# Patient Record
Sex: Female | Born: 1960 | Race: Black or African American | Hispanic: No | Smoking: Former smoker
Health system: Southern US, Community
[De-identification: ages and names within clinical notes are randomized; demographics above are authoritative.]

## PROBLEM LIST (undated history)

## (undated) DIAGNOSIS — I10 Essential (primary) hypertension: Secondary | ICD-10-CM

## (undated) HISTORY — PX: ABDOMINAL HYSTERECTOMY: SHX81

## (undated) HISTORY — PX: SKIN GRAFT: SHX250

---

## 2004-12-07 ENCOUNTER — Ambulatory Visit: Payer: Self-pay

## 2005-06-21 ENCOUNTER — Ambulatory Visit: Payer: Self-pay | Admitting: Otolaryngology

## 2006-01-16 ENCOUNTER — Ambulatory Visit: Payer: Self-pay

## 2007-01-19 ENCOUNTER — Ambulatory Visit: Payer: Self-pay

## 2007-07-17 ENCOUNTER — Emergency Department: Payer: Self-pay | Admitting: Emergency Medicine

## 2011-03-29 ENCOUNTER — Emergency Department: Payer: Self-pay | Admitting: Emergency Medicine

## 2012-03-20 ENCOUNTER — Ambulatory Visit: Payer: Self-pay | Admitting: Internal Medicine

## 2014-02-20 ENCOUNTER — Ambulatory Visit: Payer: Self-pay | Admitting: Family Medicine

## 2015-03-02 ENCOUNTER — Other Ambulatory Visit: Payer: Self-pay | Admitting: Family Medicine

## 2015-03-02 DIAGNOSIS — Z1231 Encounter for screening mammogram for malignant neoplasm of breast: Secondary | ICD-10-CM

## 2015-03-04 ENCOUNTER — Ambulatory Visit
Admission: RE | Admit: 2015-03-04 | Discharge: 2015-03-04 | Disposition: A | Discharge status: Home / Self Care | Payer: BLUE CROSS/BLUE SHIELD | Source: Ambulatory Visit | Attending: Family Medicine | Admitting: Family Medicine

## 2015-03-04 DIAGNOSIS — Z1231 Encounter for screening mammogram for malignant neoplasm of breast: Secondary | ICD-10-CM | POA: Insufficient documentation

## 2015-03-05 ENCOUNTER — Other Ambulatory Visit: Payer: Self-pay | Admitting: Family Medicine

## 2015-03-05 DIAGNOSIS — Z1231 Encounter for screening mammogram for malignant neoplasm of breast: Secondary | ICD-10-CM

## 2015-10-27 ENCOUNTER — Other Ambulatory Visit: Payer: Self-pay | Admitting: Family Medicine

## 2015-10-27 DIAGNOSIS — Z1231 Encounter for screening mammogram for malignant neoplasm of breast: Secondary | ICD-10-CM

## 2016-03-07 ENCOUNTER — Other Ambulatory Visit: Payer: Self-pay | Admitting: Family Medicine

## 2016-03-07 ENCOUNTER — Ambulatory Visit
Admission: RE | Admit: 2016-03-07 | Discharge: 2016-03-07 | Disposition: A | Discharge status: Home / Self Care | Payer: BLUE CROSS/BLUE SHIELD | Source: Ambulatory Visit | Attending: Family Medicine | Admitting: Family Medicine

## 2016-03-07 DIAGNOSIS — Z1231 Encounter for screening mammogram for malignant neoplasm of breast: Secondary | ICD-10-CM

## 2017-04-19 ENCOUNTER — Ambulatory Visit
Admission: EM | Admit: 2017-04-19 | Discharge: 2017-04-19 | Disposition: A | Discharge status: Home / Self Care | Payer: BLUE CROSS/BLUE SHIELD | Attending: Family Medicine | Admitting: Family Medicine

## 2017-04-19 DIAGNOSIS — S29011A Strain of muscle and tendon of front wall of thorax, initial encounter: Secondary | ICD-10-CM | POA: Diagnosis not present

## 2017-04-19 HISTORY — DX: Essential (primary) hypertension: I10

## 2017-04-19 MED ORDER — HYDROCODONE-ACETAMINOPHEN 5-325 MG PO TABS: ORAL_TABLET | ORAL | 0 refills | Status: DC

## 2017-04-19 MED ORDER — CYCLOBENZAPRINE HCL 10 MG PO TABS: 10.0000 mg | ORAL_TABLET | Freq: Every day | ORAL | 0 refills | Status: DC

## 2017-04-19 NOTE — ED Provider Notes (Signed)
[facility]    CSN: [id number] Arrival date & time: 04/19/17  1517     History   Chief Complaint Chief Complaint  Patient presents with  . Back Pain    HPI Evelyn Roberts is a 56 y.o. female.   56 yo female with a c/o left sided lower rib pain for 5 days since helping lift a bathtub. Pain is worse with movement and twisting.    The history is provided by the patient.  Back Pain    Past Medical History:  Diagnosis Date  . Hypertension     There are no active problems to display for this patient.   Past Surgical History:  Procedure Laterality Date  . ABDOMINAL HYSTERECTOMY    . SKIN GRAFT      OB History    No data available       Home Medications    Prior to Admission medications   Medication Sig Start Date End Date Taking? Authorizing Provider  cyclobenzaprine (FLEXERIL) 10 MG tablet Take 1 tablet (10 mg total) by mouth at bedtime. 04/19/17   Payton Mccallum, MD  HYDROcodone-acetaminophen (NORCO/VICODIN) 5-325 MG tablet 1-2 tabs po qd prn 04/19/17   Payton Mccallum, MD    Family History Family History  Problem Relation Age of Onset  . Breast cancer Cousin        pat cousin    Social History Social History  Substance Use Topics  . Smoking status: Current Every Day Smoker  . Smokeless tobacco: Never Used  . Alcohol use No     Allergies   Patient has no known allergies.   Review of Systems Review of Systems  Musculoskeletal: Positive for back pain.     Physical Exam Triage Vital Signs ED Triage Vitals  Enc Vitals Group     BP 04/19/17 1532 128/66     Pulse Rate 04/19/17 1532 76     Resp 04/19/17 1532 16     Temp 04/19/17 1532 98 F (36.7 C)     Temp Source 04/19/17 1532 Oral     SpO2 04/19/17 1532 100 %     Weight 04/19/17 1532 171 lb 4.8 oz (77.7 kg)     Height 04/19/17 1532  (1.702 m)     Head Circumference --      Peak Flow --      Pain Score 04/19/17 1535 5     Pain Loc --      Pain Edu? --    Excl. in GC? --    No data found.   Updated Vital Signs BP 128/66 (BP Location: Left Arm)   Pulse 76   Temp 98 F (36.7 C) (Oral)   Resp 16   Ht  (1.702 m)   Wt 171 lb 4.8 oz (77.7 kg)   SpO2 100%   BMI 26.83 kg/m   Visual Acuity Right Eye Distance:   Left Eye Distance:   Bilateral Distance:    Right Eye Near:   Left Eye Near:    Bilateral Near:     Physical Exam  Constitutional: She appears well-developed and well-nourished. No distress.  Cardiovascular: Normal rate, regular rhythm and normal heart sounds.   Pulmonary/Chest: Effort normal and breath sounds normal. No respiratory distress. She has no wheezes. She has no rales. She exhibits tenderness (left lower lateral chest wall ).  Skin: She is not diaphoretic.  Nursing note and vitals reviewed.    UC Treatments / Results  Labs (all  labs ordered are listed, but only abnormal results are displayed) Labs Reviewed - No data to display  EKG  EKG Interpretation None       Radiology No results found.  Procedures Procedures (including critical care time)  Medications Ordered in UC Medications - No data to display   Initial Impression / Assessment and Plan / UC Course  I have reviewed the triage vital signs and the nursing notes.  Pertinent labs & imaging results that were available during my care of the patient were reviewed by me and considered in my medical decision making (see chart for details).       Final Clinical Impressions(s) / UC Diagnoses   Final diagnoses:  Muscle strain of chest wall, initial encounter    New Prescriptions Discharge Medication List as of 04/19/2017  4:18 PM    START taking these medications   Details  cyclobenzaprine (FLEXERIL) 10 MG tablet Take 1 tablet (10 mg total) by mouth at bedtime., Starting Wed 04/19/2017, Normal    HYDROcodone-acetaminophen (NORCO/VICODIN) 5-325 MG tablet 1-2 tabs po qd prn, Print       1. diagnosis reviewed with patient 2. rx as  per orders above; reviewed possible side effects, interactions, risks and benefits  3. Recommend supportive treatment with rest, ice 4. Follow-up prn if symptoms worsen or don't improve Controlled Substance Prescriptions [place] Controlled Substance Registry consulted? No   Payton Mccallumonty, Edman Lipsey, MD 04/19/17 [phone]

## 2017-04-19 NOTE — ED Triage Notes (Signed)
Pt reports left sided thoracic back pain that started after helping lift a bathtub on Saturday. Pt reports minimal pain relief with ice and motrin.

## 2017-06-08 ENCOUNTER — Ambulatory Visit
Admission: EM | Admit: 2017-06-08 | Discharge: 2017-06-08 | Disposition: A | Discharge status: Home / Self Care | Payer: BLUE CROSS/BLUE SHIELD | Attending: Family Medicine | Admitting: Family Medicine

## 2017-06-08 DIAGNOSIS — W260XXA Contact with knife, initial encounter: Secondary | ICD-10-CM | POA: Diagnosis not present

## 2017-06-08 DIAGNOSIS — S61210A Laceration without foreign body of right index finger without damage to nail, initial encounter: Secondary | ICD-10-CM | POA: Diagnosis not present

## 2017-06-08 NOTE — ED Notes (Signed)
Wound cleansed with NS and Hibiclens 

## 2017-06-08 NOTE — ED Provider Notes (Signed)
[facility]    CSN: [id number] Arrival date & time: 06/08/17  1635     History   Chief Complaint Chief Complaint  Patient presents with  . Extremity Laceration   HPI  56 year old female presents with a right index finger laceration.  Patient states that she was reaching into her work bag and cut herself on an open box cutter.  Bleeding was controlled with pressure.  She presented immediately for evaluation.  Minimal pain.  No nail injury.  No reports of foreign body.  Tetanus up-to-date.  No other associated symptoms.  No other complaints or concerns at this time.  Past Medical History:  Diagnosis Date  . Hypertension    Past Surgical History:  Procedure Laterality Date  . ABDOMINAL HYSTERECTOMY    . SKIN GRAFT     OB History    No data available     Home Medications    Prior to Admission medications   Medication Sig Start Date End Date Taking? Authorizing Provider  lisinopril-hydrochlorothiazide (PRINZIDE,ZESTORETIC) 10-12.5 MG tablet Take 1 tablet by mouth daily.   Yes [provider]  cyclobenzaprine (FLEXERIL) 10 MG tablet Take 1 tablet (10 mg total) by mouth at bedtime. 04/19/17   Payton Mccallumonty, [place], MD  HYDROcodone-acetaminophen (NORCO/VICODIN) 5-325 MG tablet 1-2 tabs po qd prn 04/19/17   Payton Mccallumonty, [place], MD    Family History Family History  Problem Relation Age of Onset  . Breast cancer Cousin        pat cousin    Social History Social History  Substance Use Topics  . Smoking status: Current Every Day Smoker    Types: Cigarettes  . Smokeless tobacco: Never Used     Comment: 4 per day  . Alcohol use No     Allergies   Patient has no known allergies.   Review of Systems Review of Systems  Constitutional: Negative.   Skin: Positive for wound.       No nail injury.   Physical Exam Triage Vital Signs ED Triage Vitals  Enc Vitals Group     BP 06/08/17 1648 128/71     Pulse Rate 06/08/17 1648 69     Resp 06/08/17 1648 18   Temp 06/08/17 1648 98.1 F (36.7 C)     Temp Source 06/08/17 1648 Oral     SpO2 06/08/17 1648 100 %     Weight 06/08/17 1651 174 lb (78.9 kg)     Height 06/08/17 1651 5\' 7"  (1.702 m)     Head Circumference --      Peak Flow --      Pain Score 06/08/17 1651 2     Pain Loc --      Pain Edu? --      Excl. in GC? --   Updated Vital Signs BP 128/71 (BP Location: Left Arm)   Pulse 69   Temp 98.1 F (36.7 C) (Oral)   Resp 18   Ht 5\' 7"  (1.702 m)   Wt 174 lb (78.9 kg)   SpO2 100%   BMI 27.25 kg/m    Physical Exam  Constitutional: She is oriented to person, place, and time. She appears well-developed. No distress.  HENT:  Head: Normocephalic and atraumatic.  Nose: Nose normal.  Eyes: Conjunctivae are normal. No scleral icterus.  Pulmonary/Chest: Effort normal. No respiratory distress.  Neurological: She is alert and oriented to person, place, and time.  Skin:  Right index finger - ~ 1.5 cm curvilinear laceration.  Fairly superficial.  Bleeding controlled with pressure.  No nailbed involvement.  Psychiatric: She has a normal mood and affect. Her behavior is normal.  Vitals reviewed.  UC Treatments / Results  Labs (all labs ordered are listed, but only abnormal results are displayed) Labs Reviewed - No data to display  EKG  EKG Interpretation None       Radiology No results found.  Procedures .Marland KitchenLaceration Repair Date/Time: 06/08/2017 5:32 PM Performed by: Tommie Sams Authorized by: Tommie Sams   Consent:    Consent obtained:  Verbal   Consent given by:  Patient Anesthesia (see MAR for exact dosages):    Anesthesia method:  None Laceration details:    Location: Right index finger.   Length (cm):  1.5 Repair type:    Repair type:  Simple Exploration:    Hemostasis achieved with:  Direct pressure   Contaminated: no   Treatment:    Area cleansed with:  Soap and water   Amount of cleaning:  Standard Skin repair:    Repair method:  Tissue adhesive and  Steri-Strips   Number of Steri-Strips:  3 Approximation:    Approximation:  Close   V[place] border: well-aligned   Post-procedure details:    Dressing:  Bulky dressing   Patient tolerance of procedure:  Tolerated well, no immediate complications   (including critical care time)  Medications Ordered in UC Medications - No data to display   Initial Impression / Assessment and Plan / UC Course  I have reviewed the triage vital signs and the nursing notes.  Pertinent labs & imaging results that were available during my care of the patient were reviewed by me and considered in my medical decision making (see chart for details).     56 year old female presents with a finger laceration.  Superficial.  Closed with Dermabond and Steri-Strips.  Final Clinical Impressions(s) / UC Diagnoses   Final diagnoses:  Laceration of right index finger without foreign body without damage to nail, initial encounter    New Prescriptions Discharge Medication List as of 06/08/2017  5:20 PM     Controlled Substance Prescriptions [place] Controlled Substance Registry consulted? Not Applicable   Tommie Sams, DO 06/08/17 1734

## 2017-06-08 NOTE — ED Triage Notes (Signed)
Pt reached into her bookbag and didn't realize an open box cutter was in there and she sliced her first finger right hand. Minimal pain.

## 2017-06-08 NOTE — ED Notes (Signed)
telfa dressing applied over wound after closure, wrapped in kerlix and coban

## 2017-06-08 NOTE — Discharge Instructions (Signed)
You can change the dressing.  The steristrips and glue will fall of in 5-7 days.  Take care  Dr. Adriana Simasook

## 2018-02-23 ENCOUNTER — Other Ambulatory Visit: Payer: Self-pay | Admitting: Family Medicine

## 2018-02-23 DIAGNOSIS — Z1231 Encounter for screening mammogram for malignant neoplasm of breast: Secondary | ICD-10-CM

## 2018-02-28 ENCOUNTER — Ambulatory Visit
Admission: RE | Admit: 2018-02-28 | Discharge: 2018-02-28 | Disposition: A | Discharge status: Home / Self Care | Payer: BLUE CROSS/BLUE SHIELD | Source: Ambulatory Visit | Attending: Family Medicine | Admitting: Family Medicine

## 2018-02-28 DIAGNOSIS — Z1231 Encounter for screening mammogram for malignant neoplasm of breast: Secondary | ICD-10-CM | POA: Diagnosis not present

## 2019-02-22 ENCOUNTER — Other Ambulatory Visit: Payer: Self-pay | Admitting: Family Medicine

## 2019-02-22 DIAGNOSIS — Z1231 Encounter for screening mammogram for malignant neoplasm of breast: Secondary | ICD-10-CM

## 2019-03-12 ENCOUNTER — Ambulatory Visit
Admission: RE | Admit: 2019-03-12 | Discharge: 2019-03-12 | Disposition: A | Discharge status: Home / Self Care | Payer: Managed Care, Other (non HMO) | Source: Ambulatory Visit | Attending: Family Medicine | Admitting: Family Medicine

## 2019-03-12 ENCOUNTER — Other Ambulatory Visit: Payer: Self-pay

## 2019-03-12 DIAGNOSIS — Z1231 Encounter for screening mammogram for malignant neoplasm of breast: Secondary | ICD-10-CM | POA: Diagnosis not present

## 2019-11-09 ENCOUNTER — Ambulatory Visit: Payer: Self-pay | Attending: Internal Medicine

## 2019-11-09 DIAGNOSIS — Z23 Encounter for immunization: Secondary | ICD-10-CM

## 2019-11-09 NOTE — Progress Notes (Signed)
   Covid-19 Vaccination Clinic  Name:  Valentina Alcoser    MRN: [id number] DOB: [birth year]  11/09/2019  [name] was observed post Covid-19 immunization for 15 minutes without incident. She was provided with Vaccine Information Sheet and instruction to access the V-Safe system.   Ms. Rasmus was instructed to call 911 with any severe reactions post vaccine: Marland Kitchen Difficulty breathing  . Swelling of face and throat  . A fast heartbeat  . A bad rash all over body  . Dizziness and weakness   Immunizations Administered    Name Date Dose VIS Date Route   Pfizer COVID-19 Vaccine 11/09/2019  8:21 AM 0.3 mL 07/19/2019 Intramuscular   Manufacturer: [employer], [employer]   Lot: [id number]   NDC: [address]-0

## 2019-11-30 ENCOUNTER — Ambulatory Visit: Payer: Self-pay | Attending: Internal Medicine

## 2019-11-30 DIAGNOSIS — Z23 Encounter for immunization: Secondary | ICD-10-CM

## 2019-11-30 NOTE — Progress Notes (Signed)
   Covid-19 Vaccination Clinic  Name:  Evelyn Roberts    MRN: [id number] DOB: September 08, 1960  11/30/2019  [name] was observed post Covid-19 immunization for 15 minutes without incident. She was provided with Vaccine Information Sheet and instruction to access the V-Safe system.   [name] was instructed to call 911 with any severe reactions post vaccine: Marland Kitchen Difficulty breathing  . Swelling of face and throat  . A fast heartbeat  . A bad rash all over body  . Dizziness and weakness   Immunizations Administered    Name Date Dose VIS Date Route   Pfizer COVID-19 Vaccine 11/30/2019  9:36 AM 0.3 mL 10/02/2018 Intramuscular   Manufacturer: [employer], [employer]   Lot: [id number]   NDC: [address]-1

## 2019-12-03 ENCOUNTER — Ambulatory Visit: Payer: Self-pay

## 2020-03-04 ENCOUNTER — Other Ambulatory Visit: Payer: Self-pay | Admitting: Family Medicine

## 2020-03-04 DIAGNOSIS — Z1231 Encounter for screening mammogram for malignant neoplasm of breast: Secondary | ICD-10-CM

## 2020-09-25 ENCOUNTER — Ambulatory Visit
Admission: EM | Admit: 2020-09-25 | Discharge: 2020-09-25 | Disposition: A | Discharge status: Home / Self Care | Payer: BC Managed Care – PPO | Attending: Physician Assistant | Admitting: Physician Assistant

## 2020-09-25 ENCOUNTER — Encounter: Payer: Self-pay | Admitting: Emergency Medicine

## 2020-09-25 ENCOUNTER — Other Ambulatory Visit: Payer: Self-pay

## 2020-09-25 DIAGNOSIS — M545 Low back pain, unspecified: Secondary | ICD-10-CM | POA: Diagnosis not present

## 2020-09-25 DIAGNOSIS — S39012A Strain of muscle, fascia and tendon of lower back, initial encounter: Secondary | ICD-10-CM

## 2020-09-25 MED ORDER — TIZANIDINE HCL 4 MG PO TABS: 4.0000 mg | ORAL_TABLET | Freq: Three times a day (TID) | ORAL | 0 refills | Status: AC | PRN

## 2020-09-25 MED ORDER — NAPROXEN 500 MG PO TABS: 500.0000 mg | ORAL_TABLET | Freq: Two times a day (BID) | ORAL | 0 refills | Status: AC

## 2020-09-25 NOTE — ED Triage Notes (Signed)
Patient c/o lower back pain for a week.  Patient denies any urinary symptoms.  Patient denies vaginal discharge or odor.  Patient states that she did a lot of bending and yard work last Saturday.

## 2020-09-25 NOTE — Discharge Instructions (Addendum)

## 2020-09-25 NOTE — ED Provider Notes (Signed)
[facility]    CSN: [id number] Arrival date & time: 09/25/20  1509      History   Chief Complaint Chief Complaint  Patient presents with  . Back Pain    HPI Evelyn Roberts is a 60 y.o. female presenting for bilateral lower back pain for 1 week.  Patient denies any specific injury, but states that she has been doing a lot more lifting, bending and stooping.  States she has been doing that more at work and in her yard.  Says she has been helping her with yard work.  Patient says that the pain is constant and moderate and sometimes severe.  He says that she does feel pain in the back of her thighs as well.  She denies any associated numbness, weakness or tingling.  No saddle anesthesia or loss of bowel or bladder control.  Patient denies any problems with her back in the past.  She has taken over-the-counter [employer]ayer back relief with temporary improvement in symptoms.  She says she also uses heat which does help.  Patient says she has been trying to do some stretches at home and bending forward seems to feel good.  Patient denies any fever, fatigue, dysuria, urinary frequency or urgency.  No abdominal pain, nausea vomiting or diarrhea.  Patient states that she thought she would get checked out since she is not better after a week.  She has no other complaints or concerns today.  HPI  Past Medical History:  Diagnosis Date  . Hypertension     There are no problems to display for this patient.   Past Surgical History:  Procedure Laterality Date  . ABDOMINAL HYSTERECTOMY    . SKIN GRAFT      OB History   No obstetric history on file.      Home Medications    Prior to Admission medications   Medication Sig Start Date End Date Taking? Authorizing Provider  lisinopril-hydrochlorothiazide (PRINZIDE,ZESTORETIC) 10-12.5 MG tablet Take 1 tablet by mouth daily.   Yes [provider]  naproxen (NAPROSYN) 500 MG tablet Take 1 tablet (500 mg total) by mouth 2 (two)  times daily for 15 days. 09/25/20 10/10/20 Yes Eusebio Friendly B, PA-C  tiZANidine (ZANAFLEX) 4 MG tablet Take 1 tablet (4 mg total) by mouth every 8 (eight) hours as needed for up to 10 days for muscle spasms. 09/25/20 10/05/20 Yes Shirlee Latch, PA-C    Family History Family History  Problem Relation Age of Onset  . Breast cancer Cousin        2 pat cousins    Social History Social History   Tobacco Use  . Smoking status: Former Smoker    Types: Cigarettes  . Smokeless tobacco: Never Used  . Tobacco comment: 4 per day  Vaping Use  . Vaping Use: Never used  Substance Use Topics  . Alcohol use: No  . Drug use: No     Allergies   Patient has no known allergies.   Review of Systems Review of Systems  Constitutional: Negative for fatigue and fever.  Respiratory: Negative for shortness of breath.   Cardiovascular: Negative for chest pain.  Gastrointestinal: Negative for abdominal pain, diarrhea, nausea and vomiting.  Genitourinary: Negative for difficulty urinating, dysuria, frequency and urgency.  Musculoskeletal: Positive for back pain.  Skin: Negative for rash.  Neurological: Negative for weakness and numbness.     Physical Exam Triage Vital Signs ED Triage Vitals  Enc Vitals Group     BP  09/25/20 1535 129/82     Pulse Rate 09/25/20 1535 69     Resp 09/25/20 1535 14     Temp 09/25/20 1535 98.5 F (36.9 C)     Temp Source 09/25/20 1535 Oral     SpO2 09/25/20 1535 99 %     Weight 09/25/20 1532 162 lb (73.5 kg)     Height 09/25/20 1532 5\' 7"  (1.702 m)     Head Circumference --      Peak Flow --      Pain Score 09/25/20 1532 8     Pain Loc --      Pain Edu? --      Excl. in GC? --    No data found.  Updated Vital Signs BP 129/82 (BP Location: Right Arm)   Pulse 69   Temp 98.5 F (36.9 C) (Oral)   Resp 14   Ht 5\' 7"  (1.702 m)   Wt 162 lb (73.5 kg)   SpO2 99%   BMI 25.37 kg/m       Physical Exam Vitals and nursing note reviewed.  Constitutional:       General: She is not in acute distress.    Appearance: Normal appearance. She is not ill-appearing or toxic-appearing.  HENT:     Head: Normocephalic and atraumatic.  Eyes:     General: No scleral icterus.       Right eye: No discharge.        Left eye: No discharge.     Conjunctiva/sclera: Conjunctivae normal.  Cardiovascular:     Rate and Rhythm: Normal rate and regular rhythm.     Heart sounds: Normal heart sounds.  Pulmonary:     Effort: Pulmonary effort is normal. No respiratory distress.     Breath sounds: Normal breath sounds.  Abdominal:     Palpations: Abdomen is soft.     Tenderness: There is no right CVA tenderness or left CVA tenderness.  Musculoskeletal:     Cervical back: Neck supple.     Lumbar back: Tenderness (Bilateral paralumbar tenderness.  More tenderness on the right than the left.  No spinal tenderness.) present. No bony tenderness. Normal range of motion. Negative right straight leg raise test and negative left straight leg raise test.  Skin:    General: Skin is dry.  Neurological:     General: No focal deficit present.     Mental Status: She is alert. Mental status is at baseline.     Motor: No weakness.     Gait: Gait normal.  Psychiatric:        Mood and Affect: Mood normal.        Behavior: Behavior normal.        Thought Content: Thought content normal.      UC Treatments / Results  Labs (all labs ordered are listed, but only abnormal results are displayed) Labs Reviewed - No data to display  EKG   Radiology No results found.  Procedures Procedures (including critical care time)  Medications Ordered in UC Medications - No data to display  Initial Impression / Assessment and Plan / UC Course  I have reviewed the triage vital signs and the nursing notes.  Pertinent labs & imaging results that were available during my care of the patient were reviewed by me and considered in my medical decision making (see chart for details).    60 year old female presenting for atraumatic lower back pain x1 week.  Exam significant for tenderness of the paralumbar  region bilaterally, worse on the right side.  Good range of motion, strength and sensation.  All vital signs are stable in clinic and she is overall well-appearing.  Advised supportive care at this time with RICE.  Also advised taking Tylenol for pain relief.  Sent naproxen to pharmacy and tizanidine.  Encouraged use of heat and over-the-counter muscle rubs and pain relief patches.  Advised to follow-up with our department or orthopedics if not getting better over the next couple of weeks.  ED precautions for back pain reviewed with patient.   Final Clinical Impressions(s) / UC Diagnoses   Final diagnoses:  Acute bilateral low back pain without sciatica  Strain of lumbar region, initial encounter     Discharge Instructions     BACK PAIN: Stressed avoiding painful activities . RICE (REST, ICE, COMPRESSION, ELEVATION) guidelines reviewed. May alternate ice and heat. Consider use of muscle rubs, Salonpas patches, etc. Use medications as directed including muscle relaxers if prescribed. Take anti-inflammatory medications as prescribed or OTC NSAIDs/Tylenol.  F/u with PCP in 7-10 days for reexamination, and please feel free to call or return to the urgent care at any time for any questions or concerns you may have and we will be happy to help you!   BACK PAIN RED FLAGS: If the back pain acutely worsens or there are any red flag symptoms such as numbness/tingling, leg weakness, saddle anesthesia, or loss of bowel/bladder control, go immediately to the ER. Follow up with [place] as scheduled or sooner if the pain does not begin to resolve or if it worsens before the follow up      ED Prescriptions    Medication Sig Dispense Auth. Provider   naproxen (NAPROSYN) 500 MG tablet Take 1 tablet (500 mg total) by mouth 2 (two) times daily for 15 days. 30 tablet Eusebio Friendly B, PA-C    tiZANidine (ZANAFLEX) 4 MG tablet Take 1 tablet (4 mg total) by mouth every 8 (eight) hours as needed for up to 10 days for muscle spasms. 30 tablet Shirlee Latch, PA-C     I have reviewed the PDMP during this encounter.   Shirlee Latch, PA-C 09/25/20 1637

## 2020-11-17 IMAGING — MG DIGITAL SCREENING BILATERAL MAMMOGRAM WITH TOMO AND CAD
8 series · 8 of 24 positions shown · non-contrast
Comparison: Previous exam(s).

CLINICAL DATA: Screening.

EXAM:
DIGITAL SCREENING BILATERAL MAMMOGRAM WITH TOMO AND CAD

[R MLO synth-2D]
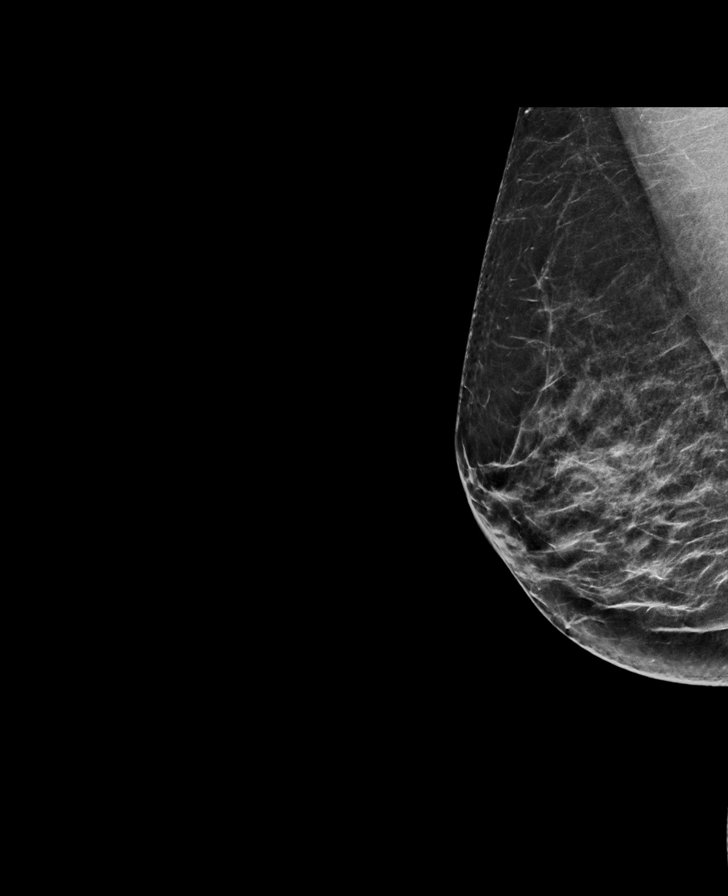

[L CC synth-2D]
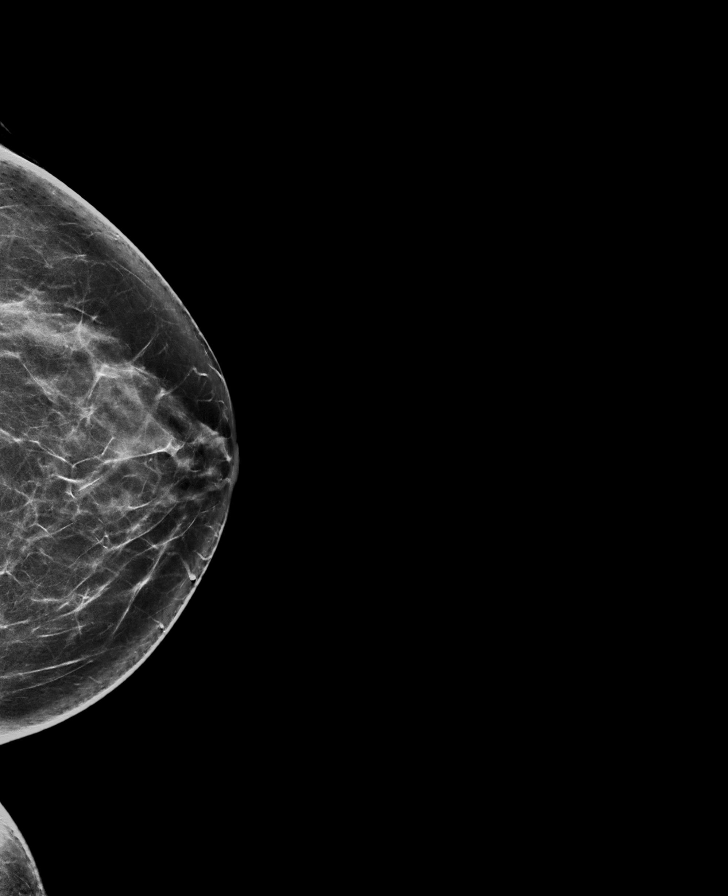

[L MLO synth-2D]
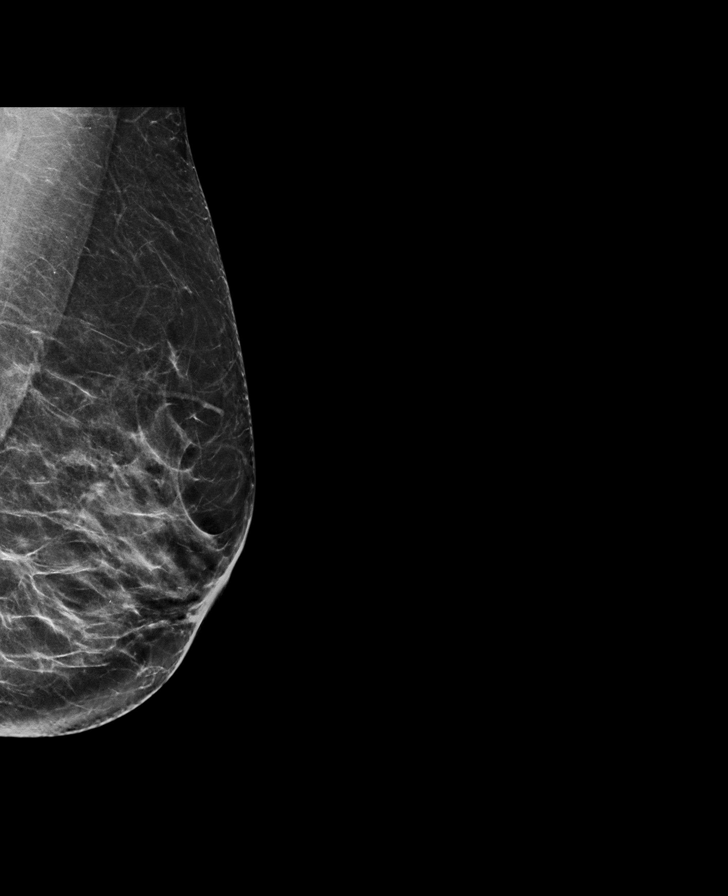

[R CC synth-2D]
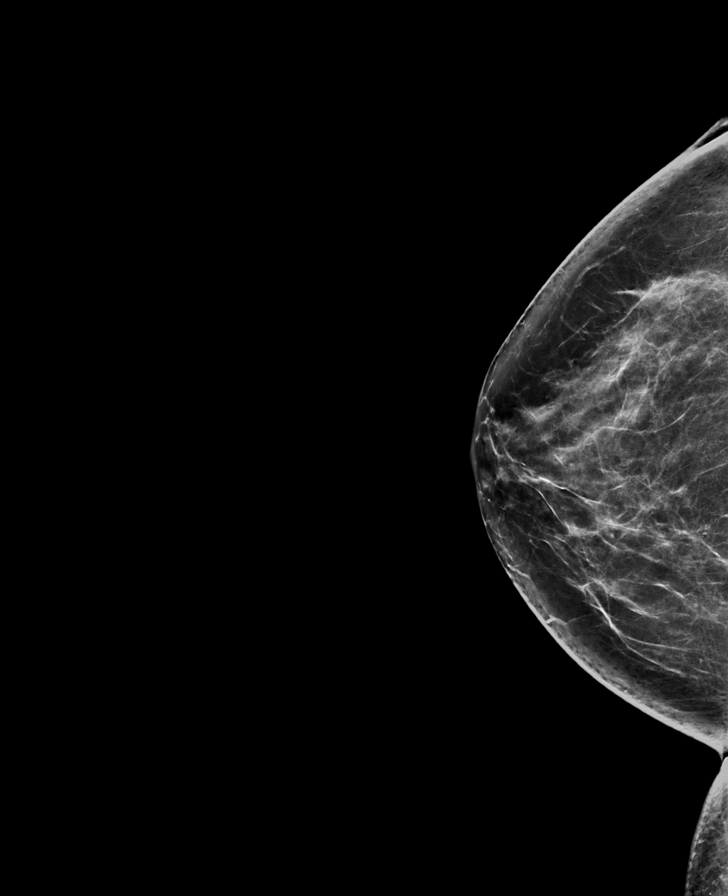

[L CC tomo · tomo slice 40/79.0]
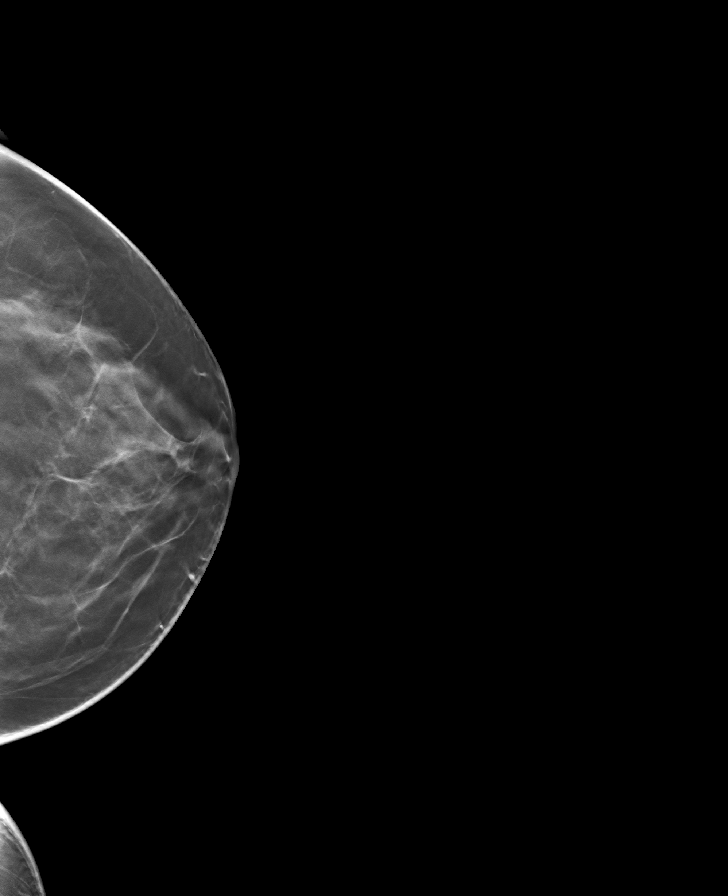

[R MLO tomo · tomo slice 35/70.0]
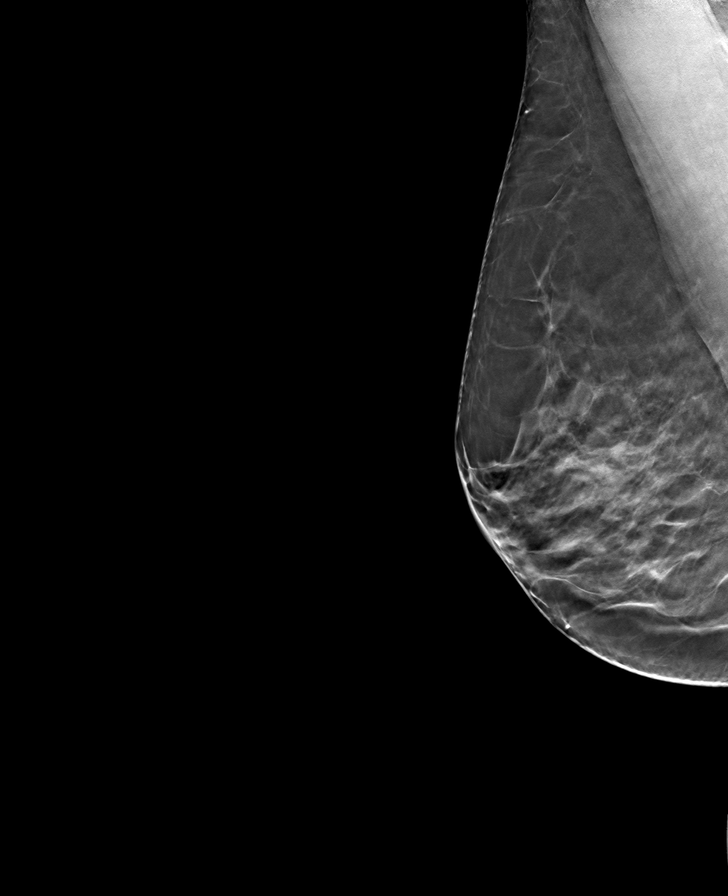

[R CC tomo · tomo slice 39/77.0]
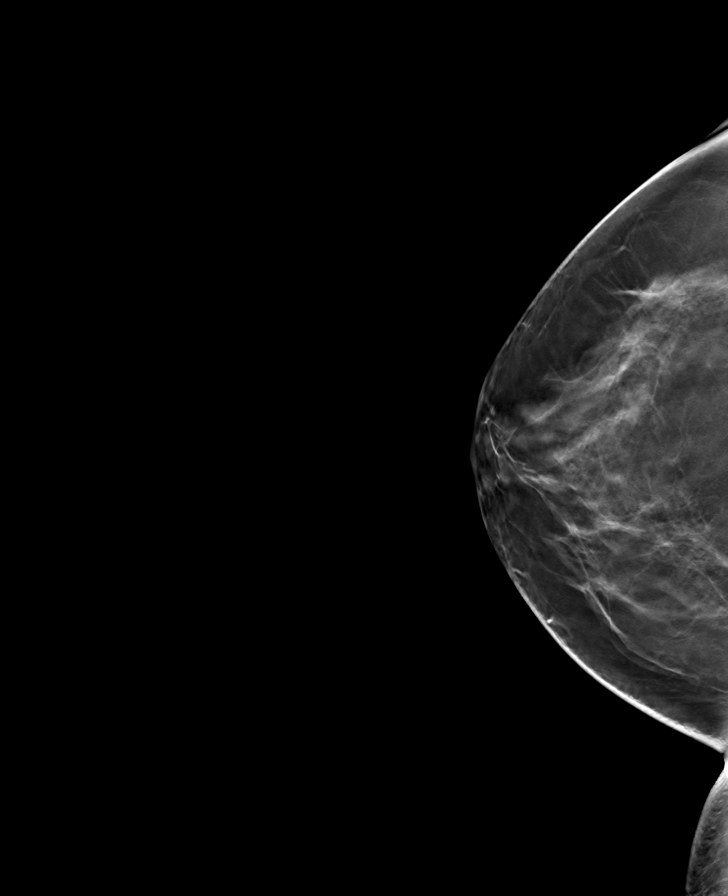

[L MLO tomo · tomo slice 37/72.0]
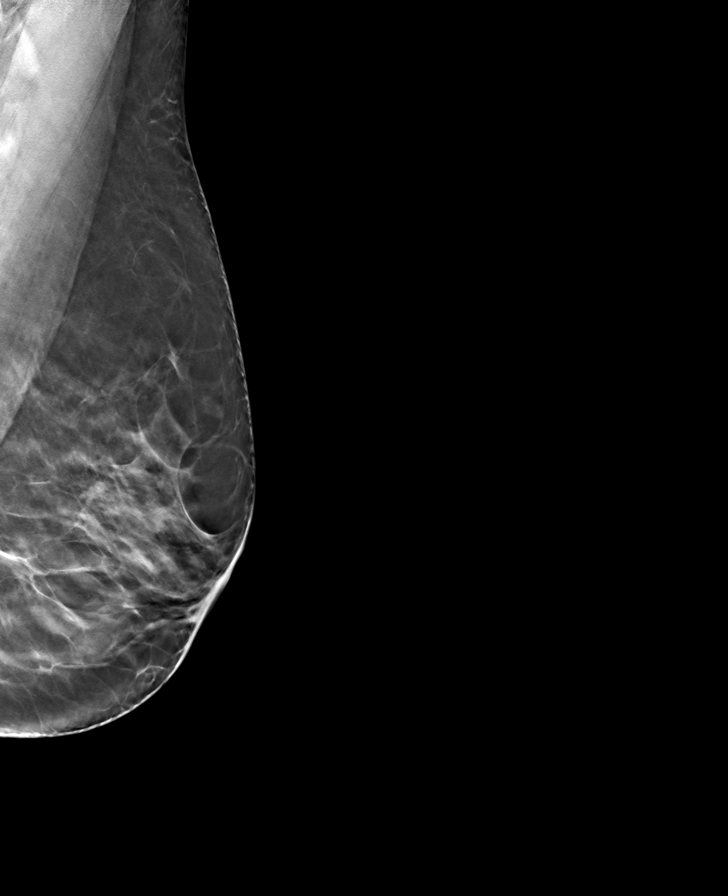

[8 of 24 positions shown; findings below may reference images not displayed]

ACR Breast Density Category c: The breast tissue is heterogeneously
dense, which may obscure small masses.
FINDINGS: There are no findings suspicious for malignancy. Images were
processed with CAD.
IMPRESSION: No mammographic evidence of malignancy. A result letter of this
screening mammogram will be mailed directly to the patient.

RECOMMENDATION:
Screening mammogram in one year. (Code:FT-U-LHB)

BI-RADS CATEGORY  1: Negative.

## 2021-01-31 ENCOUNTER — Ambulatory Visit (INDEPENDENT_AMBULATORY_CARE_PROVIDER_SITE_OTHER): Payer: 59

## 2021-01-31 ENCOUNTER — Ambulatory Visit
Admission: EM | Admit: 2021-01-31 | Discharge: 2021-01-31 | Disposition: A | Discharge status: Home / Self Care | Payer: 59

## 2021-01-31 ENCOUNTER — Encounter: Payer: Self-pay | Admitting: Emergency Medicine

## 2021-01-31 ENCOUNTER — Other Ambulatory Visit: Payer: Self-pay

## 2021-01-31 DIAGNOSIS — M7918 Myalgia, other site: Secondary | ICD-10-CM

## 2021-01-31 DIAGNOSIS — M954 Acquired deformity of chest and rib: Secondary | ICD-10-CM | POA: Diagnosis not present

## 2021-01-31 DIAGNOSIS — R079 Chest pain, unspecified: Secondary | ICD-10-CM | POA: Diagnosis not present

## 2021-01-31 MED ORDER — METAXALONE 800 MG PO TABS: 800.0000 mg | ORAL_TABLET | Freq: Three times a day (TID) | ORAL | 0 refills | Status: AC

## 2021-01-31 MED ORDER — DICLOFENAC SODIUM 50 MG PO TBEC: 50.0000 mg | DELAYED_RELEASE_TABLET | Freq: Two times a day (BID) | ORAL | 0 refills | Status: AC

## 2021-01-31 NOTE — ED Provider Notes (Signed)
[facility]    CSN: [id number] Arrival date & time: 01/31/21  1031      History   Chief Complaint Chief Complaint  Patient presents with   Shoulder Pain    left    HPI Evelyn Roberts is a 60 y.o. female who presents with  L mid scapula pain x 1 weeks. Denies injury, just woke up one day with it. Pain is provoked with movement of L arm to reach back. Admits of doing repetitive things with her L arm the day before.    Past Medical History:  Diagnosis Date   Hypertension     There are no problems to display for this patient.   Past Surgical History:  Procedure Laterality Date   ABDOMINAL HYSTERECTOMY     SKIN GRAFT      OB History   No obstetric history on file.      Home Medications    Prior to Admission medications   Medication Sig Start Date End Date Taking? Authorizing Provider  diclofenac (VOLTAREN) 50 MG EC tablet Take 1 tablet (50 mg total) by mouth 2 (two) times daily. For pain and inflammation 01/31/21  Yes Rodriguez-Southworth, Nettie Elm, PA-C  lisinopril-hydrochlorothiazide (PRINZIDE,ZESTORETIC) 10-12.5 MG tablet Take 1 tablet by mouth daily.   Yes [provider]  metaxalone (SKELAXIN) 800 MG tablet Take 1 tablet (800 mg total) by mouth 3 (three) times daily. 01/31/21  Yes Rodriguez-Southworth, Nettie Elm, PA-C  Potassium Chloride ER 20 MEQ TBCR Take 1 tablet by mouth daily. 01/31/21  Yes [provider]    Family History Family History  Problem Relation Age of Onset   Breast cancer Cousin        2 pat cousins    Social History Social History   Tobacco Use   Smoking status: Former    Pack years: 0.00    Types: Cigarettes   Smokeless tobacco: Never   Tobacco comments:    4 per day  Vaping Use   Vaping Use: Never used  Substance Use Topics   Alcohol use: No   Drug use: No     Allergies   Patient has no known allergies.   Review of Systems Review of Systems  Constitutional:  Negative for fever and  unexpected weight change.  Respiratory:  Negative for cough, chest tightness and shortness of breath.   Cardiovascular:  Negative for chest pain.  Musculoskeletal:  Positive for back pain. Negative for arthralgias.  Skin:  Negative for rash and wound.  Hematological:  Negative for adenopathy.    Physical Exam Triage Vital Signs ED Triage Vitals  Enc Vitals Group     BP 01/31/21 1050 135/78     Pulse Rate 01/31/21 1050 61     Resp 01/31/21 1050 18     Temp 01/31/21 1050 98.5 F (36.9 C)     Temp Source 01/31/21 1050 Oral     SpO2 01/31/21 1050 100 %     Weight 01/31/21 1051 162 lb 0.6 oz (73.5 kg)     Height 01/31/21 1051 5\' 7"  (1.702 m)     Head Circumference --      Peak Flow --      Pain Score 01/31/21 1051 3     Pain Loc --      Pain Edu? --      Excl. in GC? --    No data found.  Updated Vital Signs BP 135/78 (BP Location: Right Arm)   Pulse 61   Temp 98.5  F (36.9 C) (Oral)   Resp 18   Ht 5\' 7"  (1.702 m)   Wt 162 lb 0.6 oz (73.5 kg)   SpO2 100%   BMI 25.38 kg/m   Visual Acuity Right Eye Distance:   Left Eye Distance:   Bilateral Distance:    Right Eye Near:   Left Eye Near:    Bilateral Near:     Physical Exam Constitutional:      Appearance: She is normal weight.  HENT:     Head: Normocephalic.     Right Ear: External ear normal.     Left Ear: External ear normal.  Eyes:     General: No scleral icterus.    Conjunctiva/sclera: Conjunctivae normal.  Neck:     Comments: Has tenderness on L rhomboid region, provoked with flexing her head down Cardiovascular:     Rate and Rhythm: Normal rate and regular rhythm.     Heart sounds: No murmur heard. Pulmonary:     Effort: Pulmonary effort is normal.     Breath sounds: Normal breath sounds.     Comments: Her L upper anterior chest wall is prominent compared to the L, but is not tender and she has not noticed this at all Musculoskeletal:     Cervical back: Neck supple.  Lymphadenopathy:      Cervical: No cervical adenopathy.  Skin:    General: Skin is warm and dry.     Findings: No rash.  Neurological:     Mental Status: She is alert and oriented to person, place, and time.     Gait: Gait normal.  Psychiatric:        Mood and Affect: Mood normal.        Behavior: Behavior normal.        Thought Content: Thought content normal.        Judgment: Judgment normal.     UC Treatments / Results  Labs (all labs ordered are listed, but only abnormal results are displayed) Labs Reviewed - No data to display  EKG   Radiology DG Chest 2 View  Result Date: 01/31/2021 CLINICAL DATA:  LEFT anterior chest wall swelling and shoulder pain with movement. EXAM: CHEST - 2 VIEW COMPARISON:  None FINDINGS: Trachea is midline. Cardiomediastinal contours and hilar structures are normal. Lungs are clear.  No effusion.  No consolidation.  No pneumothorax. On limited assessment no acute skeletal process. IMPRESSION: No acute cardiopulmonary disease. Electronically Signed   By: 02/02/2021 M.D.   On: 01/31/2021 11:47    Procedures Procedures (including critical care time)  Medications Ordered in UC Medications - No data to display  Initial Impression / Assessment and Plan / UC Course  I have reviewed the triage vital signs and the nursing notes. Pertinent imaging results that were available during my care of the patient were reviewed by me and considered in my medical decision making (see chart for details). I ordered a chest xray to make sure there was no tumor or mass in her L chest and was neg. Has L rhomboid pain, and I placed her on Diclofenac and Skelaxin as noted.  Needs to FU with PCP next week.   Final Clinical Impressions(s) / UC Diagnoses   Final diagnoses:  Rhomboid muscle pain  Chest wall deformity     Discharge Instructions      Please follow up with your family Doctor next week for your back pain and chest wall swelling.       ED  Prescriptions      Medication Sig Dispense Auth. Provider   metaxalone (SKELAXIN) 800 MG tablet Take 1 tablet (800 mg total) by mouth 3 (three) times daily. 21 tablet Rodriguez-Southworth, Nettie Elm, PA-C   diclofenac (VOLTAREN) 50 MG EC tablet Take 1 tablet (50 mg total) by mouth 2 (two) times daily. For pain and inflammation 14 tablet Rodriguez-Southworth, Nettie Elm, PA-C      PDMP not reviewed this encounter.   Garey Ham, PA-C 01/31/21 1200

## 2021-01-31 NOTE — Discharge Instructions (Addendum)
Please follow up with your family Doctor next week for your back pain and chest wall swelling.

## 2021-01-31 NOTE — ED Triage Notes (Signed)
Pt c/o left shoulder pain. Started about a week ago. She states she woke up with it, no known injury. She states when she moves it certain ways she has a sharp pain.

## 2022-01-25 ENCOUNTER — Other Ambulatory Visit: Payer: Self-pay | Admitting: Family Medicine

## 2022-01-25 DIAGNOSIS — Z1231 Encounter for screening mammogram for malignant neoplasm of breast: Secondary | ICD-10-CM

## 2022-02-16 ENCOUNTER — Ambulatory Visit
Admission: RE | Admit: 2022-02-16 | Discharge: 2022-02-16 | Disposition: A | Discharge status: Home / Self Care | Payer: 59 | Source: Ambulatory Visit | Attending: Family Medicine | Admitting: Family Medicine

## 2022-02-16 DIAGNOSIS — Z1231 Encounter for screening mammogram for malignant neoplasm of breast: Secondary | ICD-10-CM | POA: Insufficient documentation

## 2022-10-09 IMAGING — CR DG CHEST 2V
2 series · 2 of 2 positions shown · non-contrast
Comparison: None

CLINICAL DATA: LEFT anterior chest wall swelling and shoulder pain
with movement.

EXAM:
CHEST - 2 VIEW

[chest pa]
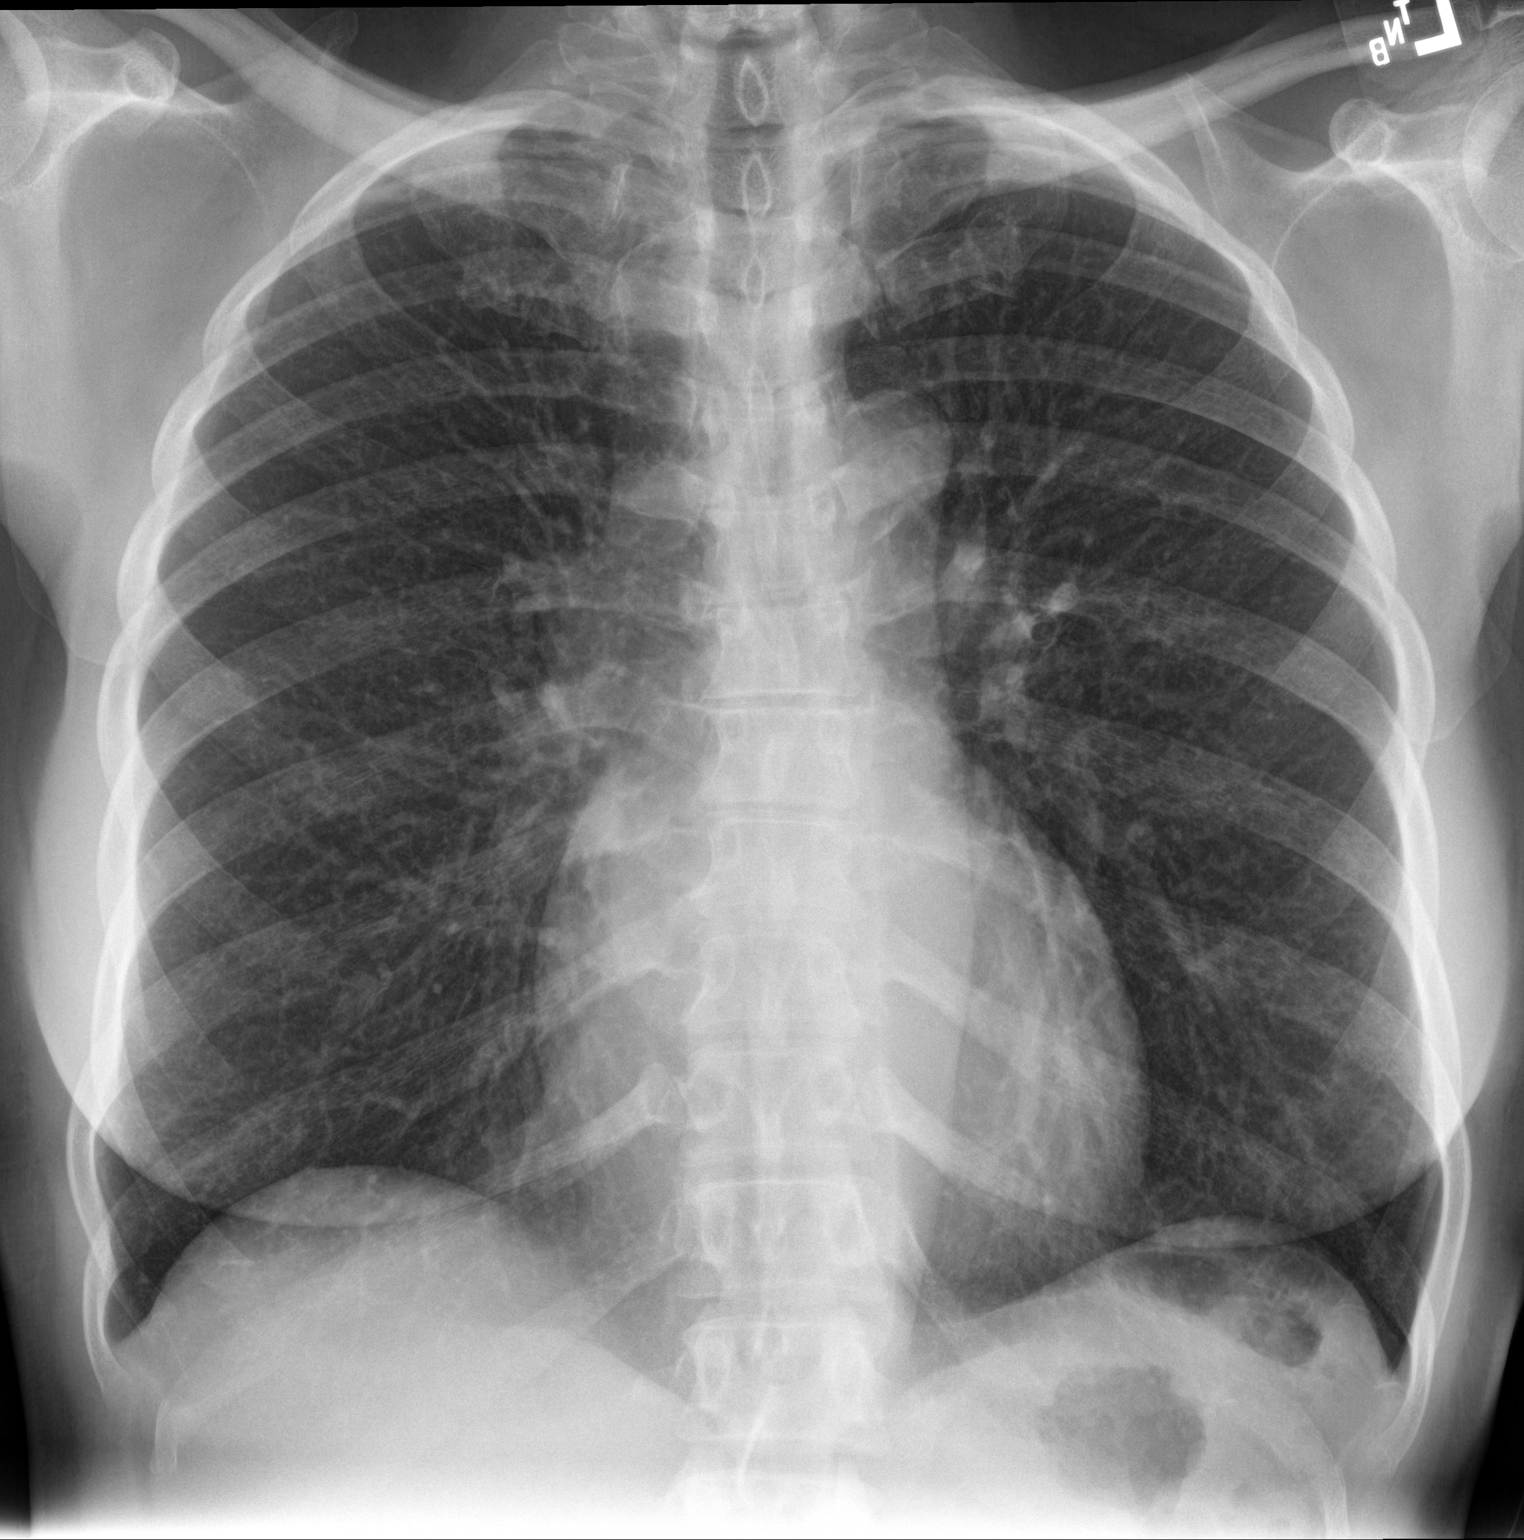

[chest lat]
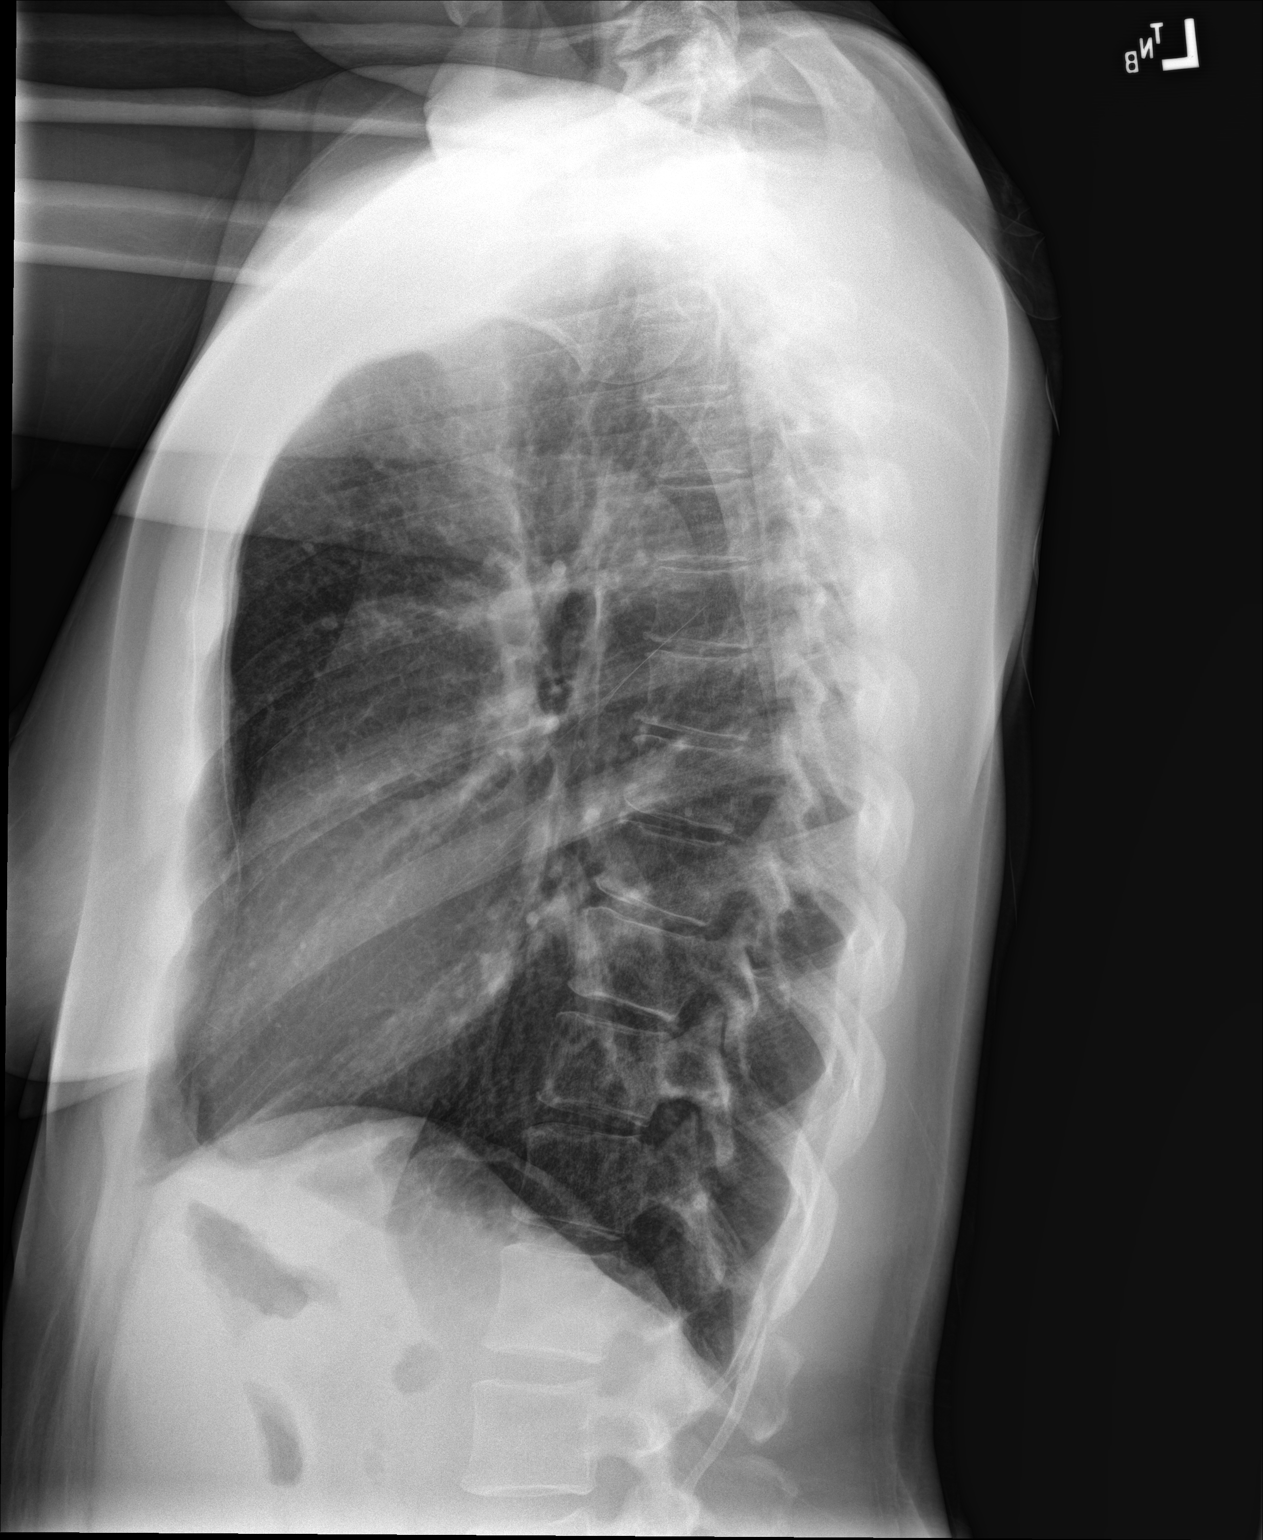

[2 of 2 positions shown; findings below may reference images not displayed]

FINDINGS: Trachea is midline. Cardiomediastinal contours and hilar structures
are normal.

Lungs are clear.  No effusion.  No consolidation.  No pneumothorax.

On limited assessment no acute skeletal process.
IMPRESSION: No acute cardiopulmonary disease.

## 2023-01-17 ENCOUNTER — Other Ambulatory Visit: Payer: Self-pay | Admitting: Family Medicine

## 2023-01-17 DIAGNOSIS — Z1231 Encounter for screening mammogram for malignant neoplasm of breast: Secondary | ICD-10-CM

## 2023-02-20 ENCOUNTER — Ambulatory Visit
Admission: RE | Admit: 2023-02-20 | Discharge: 2023-02-20 | Disposition: A | Discharge status: Home / Self Care | Payer: 59 | Source: Ambulatory Visit | Attending: Family Medicine | Admitting: Family Medicine

## 2023-02-20 DIAGNOSIS — Z1231 Encounter for screening mammogram for malignant neoplasm of breast: Secondary | ICD-10-CM
